# Patient Record
Sex: Male | Born: 1970 | Race: White | Hispanic: No | Marital: Single | Smoking: Current every day smoker
Health system: Southern US, Community
[De-identification: ages and names within clinical notes are randomized; demographics above are authoritative.]

## PROBLEM LIST (undated history)

## (undated) DIAGNOSIS — K219 Gastro-esophageal reflux disease without esophagitis: Secondary | ICD-10-CM

## (undated) HISTORY — PX: HIP FRACTURE SURGERY: SHX118

## (undated) HISTORY — PX: HUMERUS FRACTURE SURGERY: SHX670

---

## 2000-10-21 ENCOUNTER — Inpatient Hospital Stay (HOSPITAL_COMMUNITY)
Admission: RE | Admit: 2000-10-21 | Discharge: 2000-10-28 | Payer: Self-pay | Admitting: Physical Medicine and Rehabilitation

## 2008-07-24 ENCOUNTER — Emergency Department: Payer: Self-pay | Admitting: Emergency Medicine

## 2009-02-06 ENCOUNTER — Inpatient Hospital Stay: Payer: Self-pay | Admitting: Internal Medicine

## 2010-03-09 ENCOUNTER — Emergency Department: Payer: Self-pay | Admitting: Emergency Medicine

## 2010-11-26 ENCOUNTER — Emergency Department: Payer: Self-pay | Admitting: Emergency Medicine

## 2011-05-22 ENCOUNTER — Emergency Department: Payer: Self-pay | Admitting: *Deleted

## 2011-09-14 ENCOUNTER — Emergency Department: Payer: Self-pay | Admitting: Emergency Medicine

## 2011-10-07 ENCOUNTER — Emergency Department: Payer: Self-pay | Admitting: Emergency Medicine

## 2011-10-09 ENCOUNTER — Emergency Department: Payer: Self-pay | Admitting: Emergency Medicine

## 2011-10-11 ENCOUNTER — Emergency Department: Payer: Self-pay | Admitting: Emergency Medicine

## 2011-10-13 LAB — WOUND CULTURE

## 2013-06-12 IMAGING — CR CERVICAL SPINE - COMPLETE 4+ VIEW
1 series · 8 of 8 positions shown · non-contrast
Comparison: none

REASON FOR EXAM: painj
COMMENTS:   May transport without cardiac monitor

[Series 1: lat · 0.17mm/px · 8 of 8 slices shown]
[im 1/8]
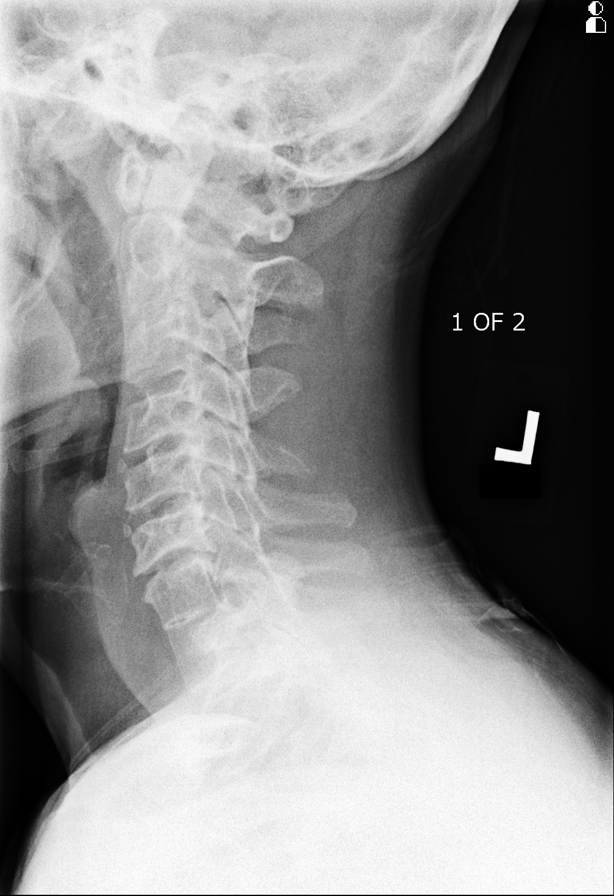
[im 2/8]
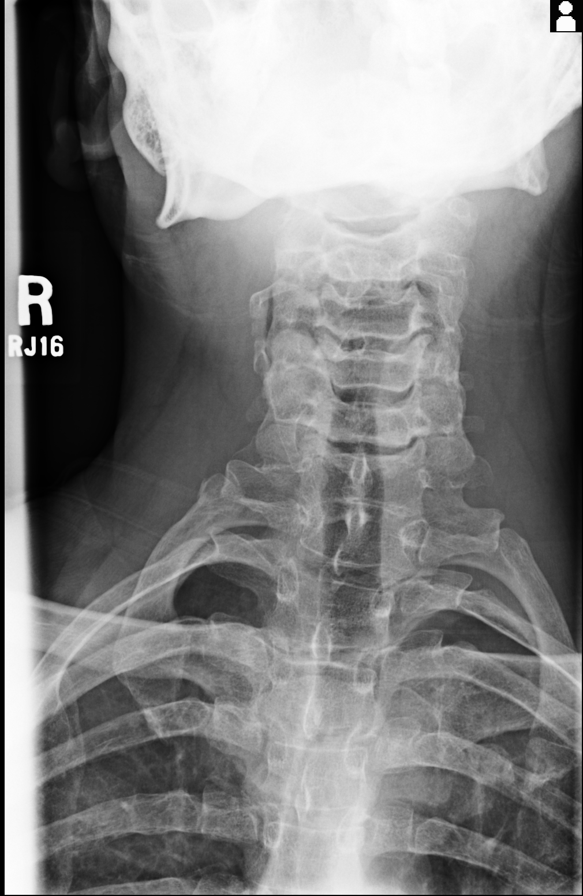
[im 3/8]
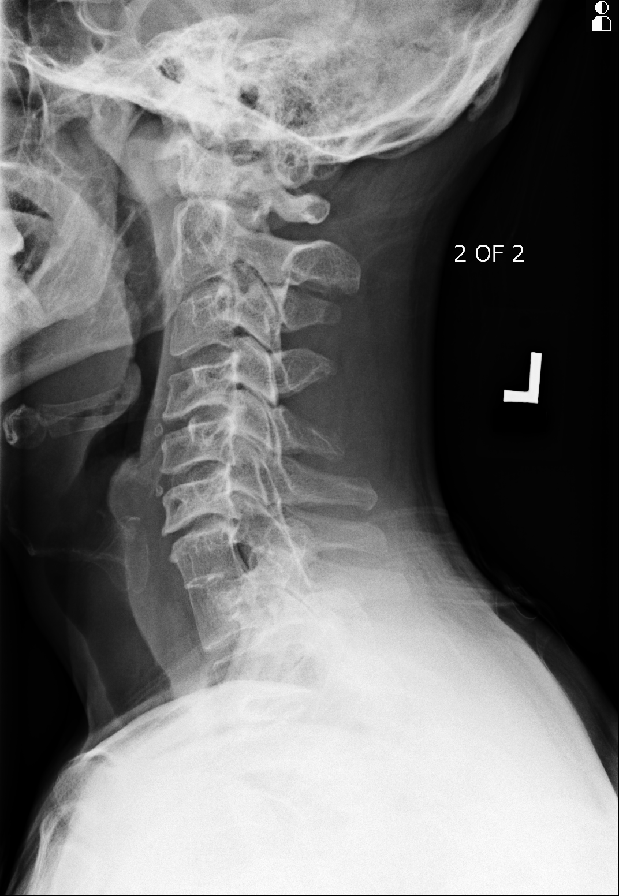
[im 4/8]
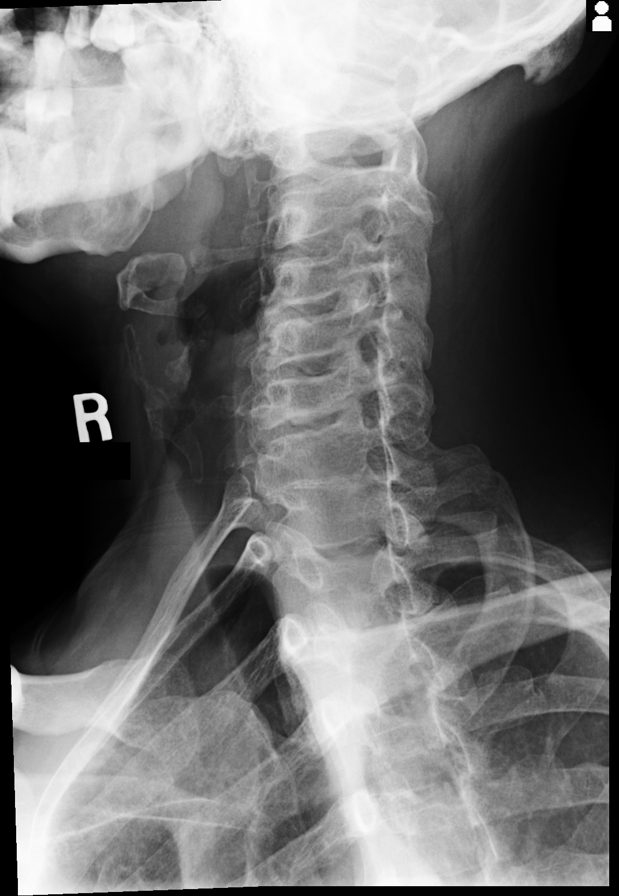
[im 5/8]
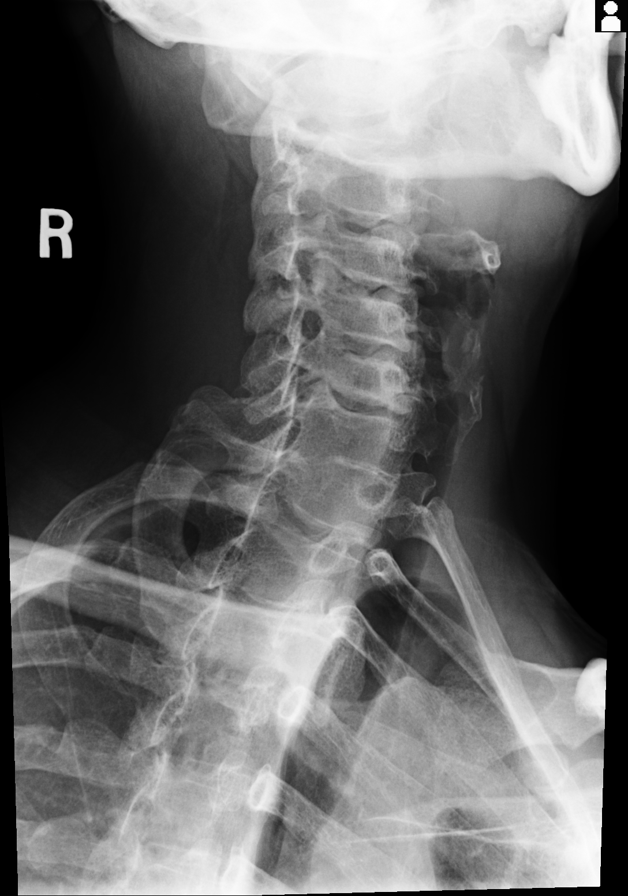
[im 6/8]
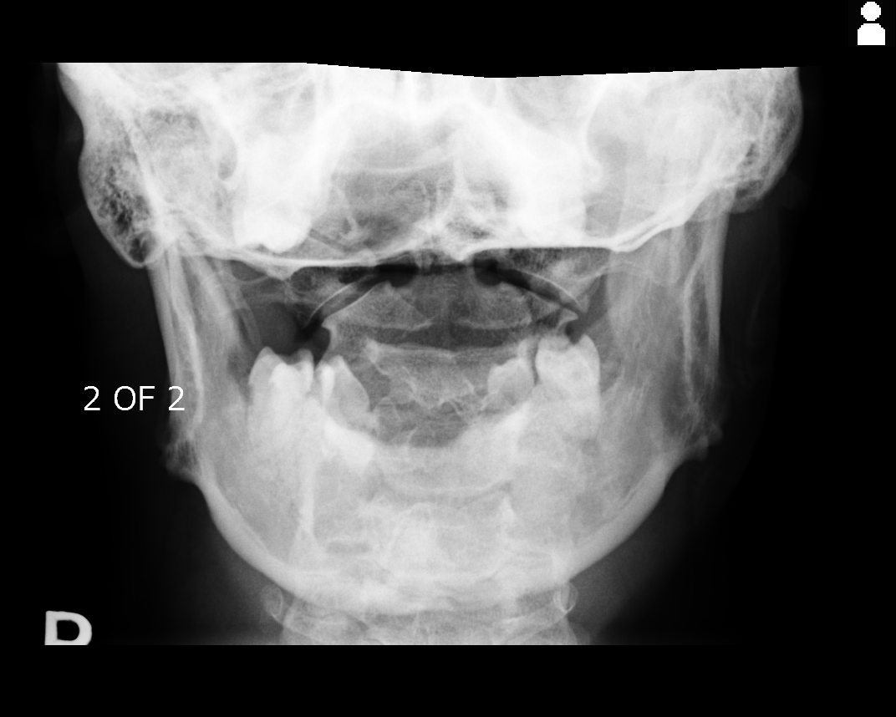
[im 7/8]
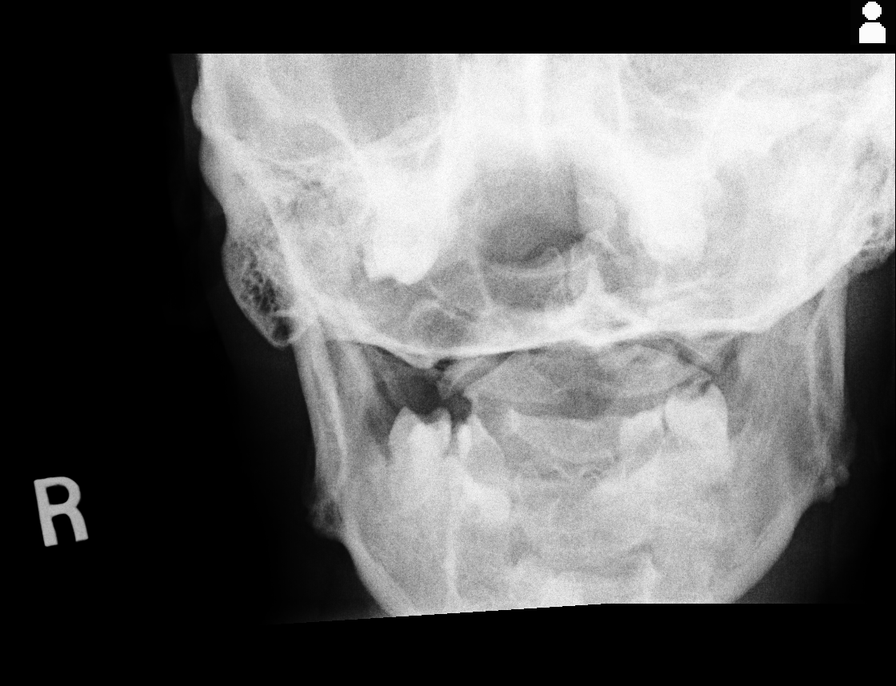
[im 8/8]
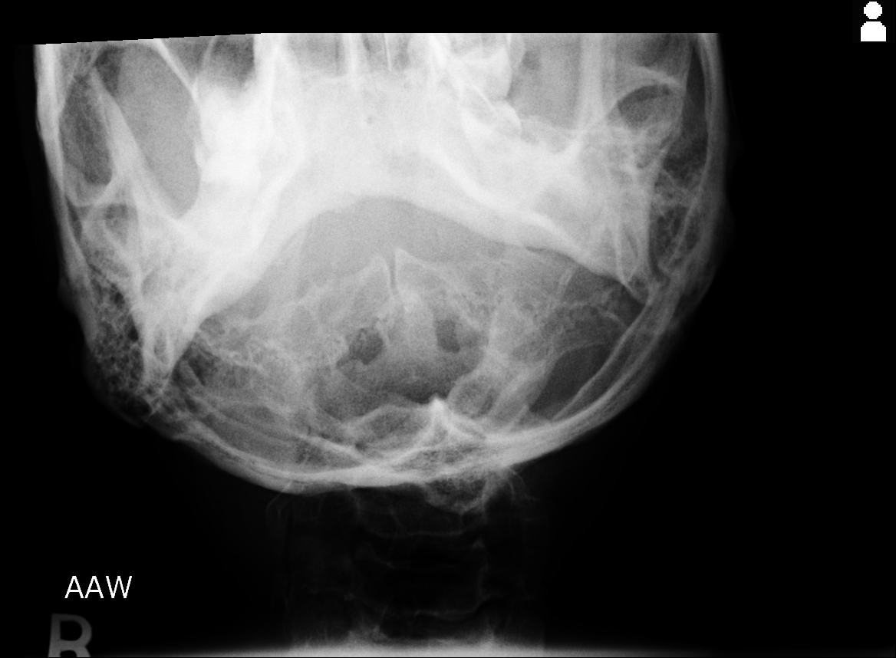

[8 of 8 positions shown; findings below may reference images not displayed]

PROCEDURE:     DXR - DXR CERVICAL SPINE COMPLETE  - September 14, 2011 [DATE]

RESULT:     The lateral film is technically limited and was repeated. The
cervical vertebral bodies are preserved in height. There is fusion across
the C7-T1 disc. The prevertebral soft tissue spaces appear normal. There is
no evidence of a perched facet. There is mild bony encroachment upon the
neural foramina at multiple levels. The lateral masses of C1 appear mildly
splayed with respect to C2. The odontoid is only partially imaged.
IMPRESSION: The study is limited. There are degenerative changes
present. I cannot exclude an acute fracture however, CT scanning is
recommended

## 2013-06-12 IMAGING — CT CT CERVICAL SPINE WITHOUT CONTRAST
1 series · 12 of 14 positions shown, 15 images · non-contrast
Comparison: none

REASON FOR EXAM: pain injury
COMMENTS:

PROCEDURE:     CT  - CT CERVICAL SPINE WO  - September 14, 2011  [DATE]
RESULT:     Cervical spine CT dated 09/14/2011.
TECHNIQUE: Multiplanar imaging the cervical spine was obtained utilizing
helical 2 mm acquisition and bone reconstruction algorithm.

[Series 5: axial · axial · 0.33mm/px · z∈[+336,+474]mm · 12 of 83 slices shown, 15 images]
[im 7/83  soft-tissue]
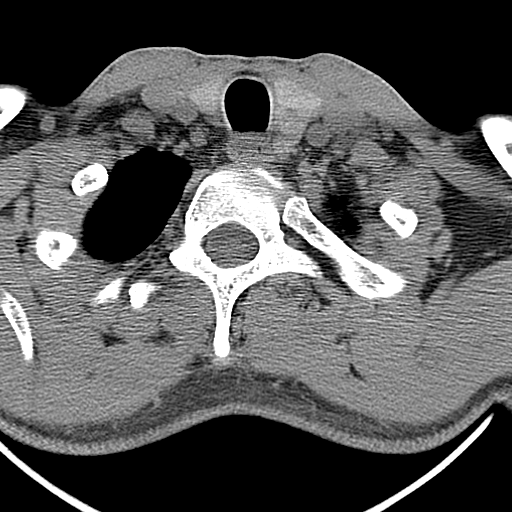
[im 7/83  bone]
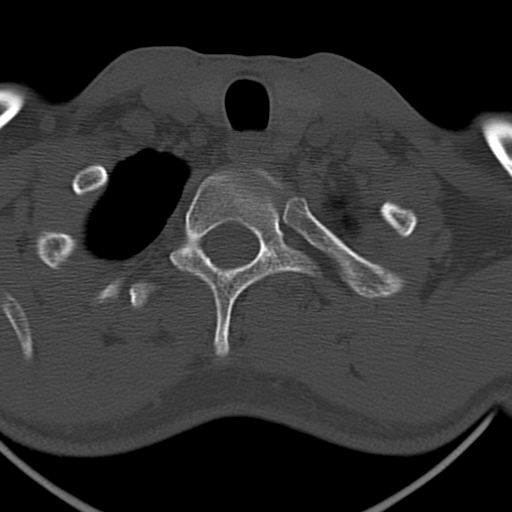
[im 13/83  bone]
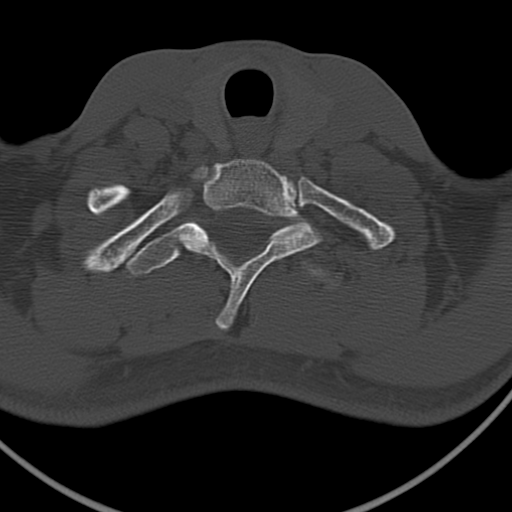
[im 19/83  bone]
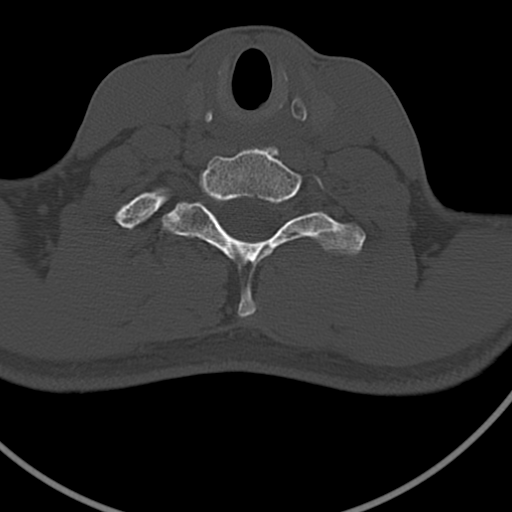
[im 26/83  bone]
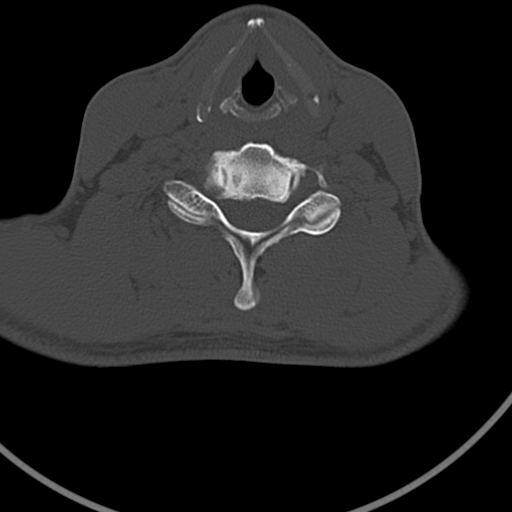
[im 32/83  soft-tissue]
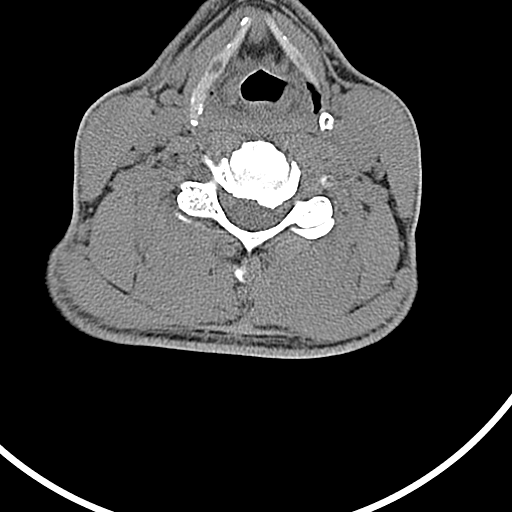
[im 32/83  bone]
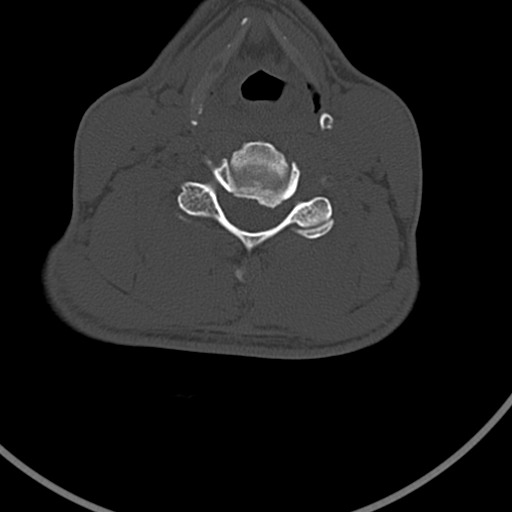
[im 38/83  bone]
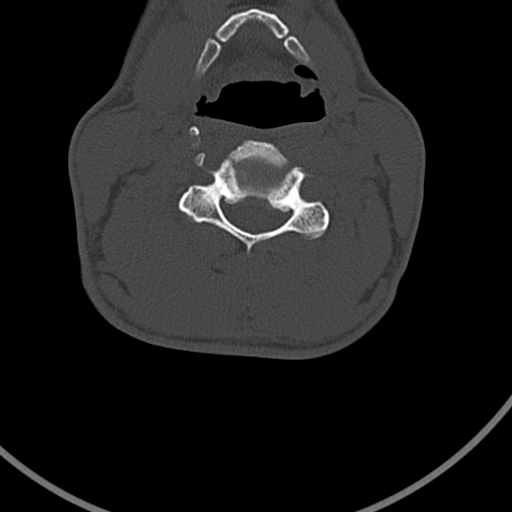
[im 45/83  bone]
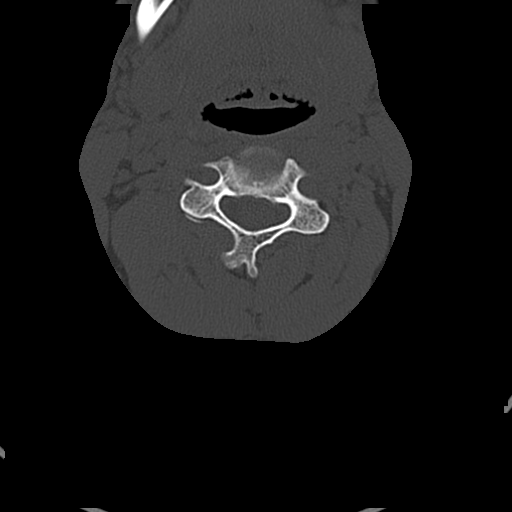
[im 51/83  bone]
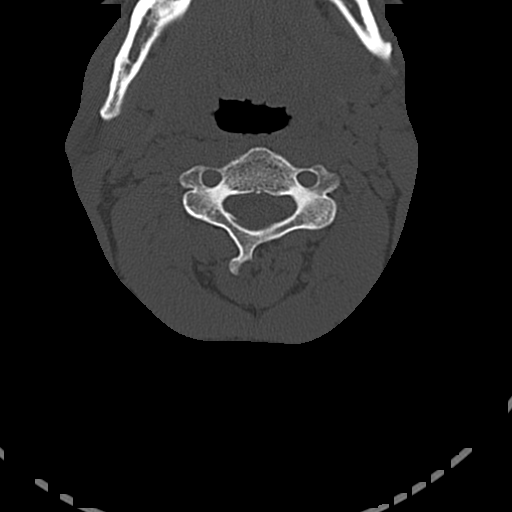
[im 57/83  soft-tissue]
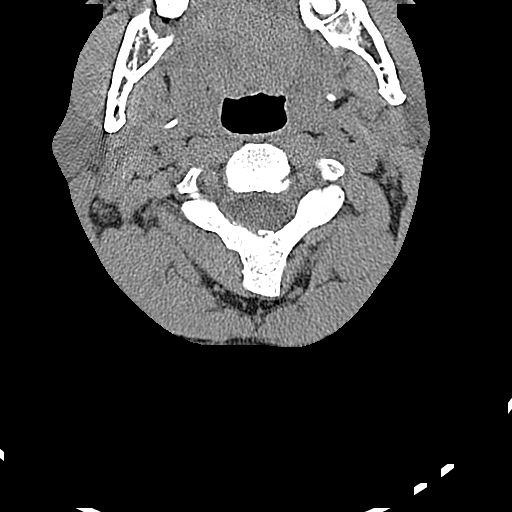
[im 57/83  bone]
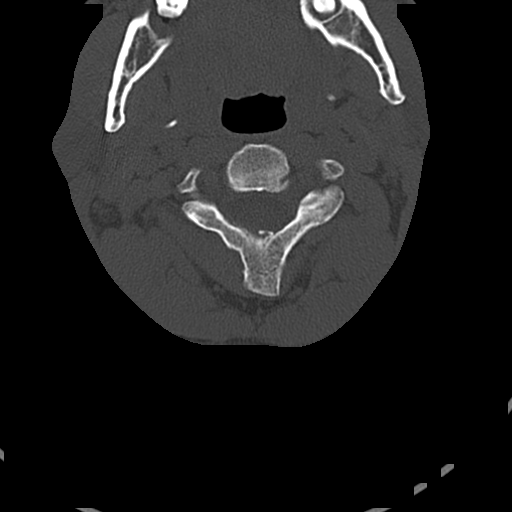
[im 64/83  bone]
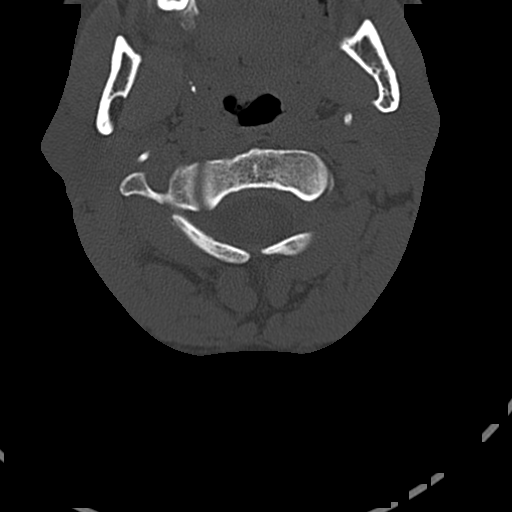
[im 70/83  bone]
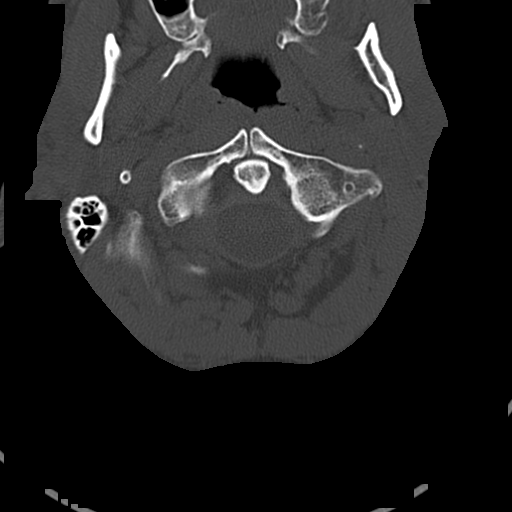
[im 76/83  bone]
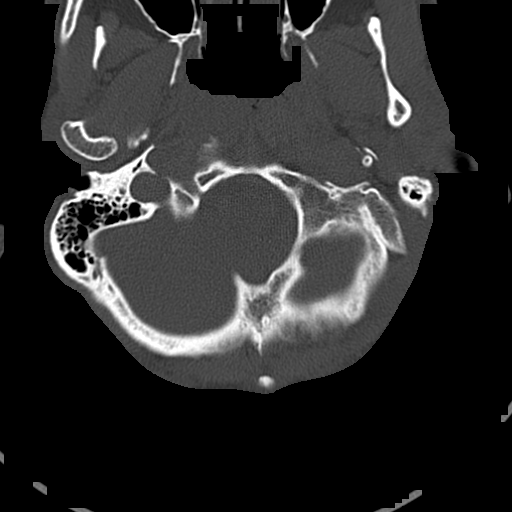

[12 of 14 positions shown; findings below may reference images not displayed]

FINDINGS: There is no evidence of acute fracture nor dislocation. There is a
mild endplate hypertrophic spurring identified. There is fusion of the C7-T1
vertebral bodies. Degenerative disc disease changes are identified, mild, in
the lower cervical spine. There is no evidence of prevertebral soft tissue
swelling.
IMPRESSION: No evidence of acute osseous abnormalities.

## 2020-08-21 ENCOUNTER — Other Ambulatory Visit: Payer: Self-pay

## 2020-08-21 ENCOUNTER — Encounter: Payer: Self-pay | Admitting: Emergency Medicine

## 2020-08-21 ENCOUNTER — Emergency Department: Payer: 59

## 2020-08-21 ENCOUNTER — Emergency Department
Admission: EM | Admit: 2020-08-21 | Discharge: 2020-08-21 | Disposition: A | Discharge status: Home / Self Care | Payer: 59 | Attending: Emergency Medicine | Admitting: Emergency Medicine

## 2020-08-21 DIAGNOSIS — F17210 Nicotine dependence, cigarettes, uncomplicated: Secondary | ICD-10-CM | POA: Diagnosis not present

## 2020-08-21 DIAGNOSIS — M79644 Pain in right finger(s): Secondary | ICD-10-CM | POA: Diagnosis present

## 2020-08-21 DIAGNOSIS — M778 Other enthesopathies, not elsewhere classified: Secondary | ICD-10-CM | POA: Insufficient documentation

## 2020-08-21 NOTE — ED Notes (Signed)
See triage note  Presents with pain and some swelling to right hand  Swelling is mainly at thumb area   Unsure of injury but states he fell couple of weeks ago

## 2020-08-21 NOTE — Discharge Instructions (Addendum)
Call make an appointment with Dr. Okey Dupre who is the orthopedist on-call.  Wear the thumb spica splint for thumb protection and support.  Begin taking meloxicam 1 daily with food for inflammation.  Discontinue taking the ibuprofen that you have been taking at home.

## 2020-08-21 NOTE — ED Provider Notes (Signed)
[facility] Emergency Department Provider Note   ____________________________________________   Event Date/Time   First MD Initiated Contact with Patient 08/21/20 0725     (approximate)  I have reviewed the triage vital signs and the nursing notes.   HISTORY  Chief Complaint Hand Pain   HPI Carl Pena is a 50 y.o. male presents to the ED with complaint of right hand pain for 3 weeks.  Patient is unaware of any known injury.  He states that the pain more to his right thumb.  He has taken ibuprofen occasionally with little improvement.  He states that range of motion with his thumb is difficult at times.  He rates pain as 9 out of 10.       History reviewed. No pertinent past medical history.  There are no problems to display for this patient.   Past Surgical History:  Procedure Laterality Date   HIP FRACTURE SURGERY Right    HUMERUS FRACTURE SURGERY Left     Prior to Admission medications   Not on File    Allergies Patient has no known allergies.  No family history on file.  Social History Social History   Tobacco Use   Smoking status: Every Day    Pack years: 0.00    Types: Cigarettes    Review of Systems Constitutional: No fever/chills Eyes: No visual changes. Cardiovascular: Denies chest pain. Respiratory: Denies shortness of breath. Gastrointestinal: No nausea, no vomiting.  Musculoskeletal: Right thumb pain. Skin: Negative for rash. Neurological: Negative for focal weakness or numbness. ____________________________________________   PHYSICAL EXAM:  VITAL SIGNS: ED Triage Vitals  Enc Vitals Group     BP 08/21/20 0636 (!) 141/89     Pulse Rate 08/21/20 0636 64     Resp 08/21/20 0636 18     Temp 08/21/20 0636 97.9 F (36.6 C)     Temp Source 08/21/20 0636 Oral     SpO2 08/21/20 0636 98 %     Weight 08/21/20 0632 150 lb (68 kg)     Height 08/21/20 0632 5\' 7"  (1.702 m)     Head Circumference --      Peak  Flow --      Pain Score 08/21/20 0632 9     Pain Loc --      Pain Edu? --      Excl. in GC? --     Constitutional: Alert and oriented. Well appearing and in no acute distress. Eyes: Conjunctivae are normal. PERRL. EOMI. Head: Atraumatic. Neck: No stridor.   Cardiovascular: Normal rate, regular rhythm. Grossly normal heart sounds.  Good peripheral circulation. Respiratory: Normal respiratory effort.  No retractions. Lungs CTAB. Musculoskeletal: Examination of the right hand there is no gross deformity.  Patient has difficulty flexing the distal phalanx.  He is able to passively flex the phalanx and then extend.  No swelling to the joint, erythema or warmth is noted.  Motor sensory function intact.  Capillary refill is less than 3 seconds.  Radial pulse present. Neurologic:  Normal speech and language. No gross focal neurologic deficits are appreciated. No gait instability. Skin:  Skin is warm, dry and intact. No rash noted. Psychiatric: Mood and affect are normal. Speech and behavior are normal.  ____________________________________________   LABS (all labs ordered are listed, but only abnormal results are displayed)  Labs Reviewed - No data to display ____________________________________________  ____________________________________________  RADIOLOGY 10/21/20, personally viewed and evaluated these images (plain radiographs) as part  of my medical decision making, as well as reviewing the written report by the radiologist.   Official radiology report(s): DG Hand Complete Right  Result Date: 08/21/2020 CLINICAL DATA:  Right hand pain EXAM: RIGHT HAND - COMPLETE 3+ VIEW COMPARISON:  None. FINDINGS: There is no evidence of fracture or dislocation. There is no evidence of arthropathy or other focal bone abnormality. Soft tissues are unremarkable. IMPRESSION: Negative. Electronically Signed   By: Marnee Spring M.D.   On: 08/21/2020 07:35     ____________________________________________   PROCEDURES  Procedure(s) performed (including Critical Care):  Procedures  Thumb spica splint was applied by ED tech. ____________________________________________   INITIAL IMPRESSION / ASSESSMENT AND PLAN / ED COURSE  As part of my medical decision making, I reviewed the following data within the electronic MEDICAL RECORD NUMBER   50 year old male presents to the ED with complaint of pain to the base of his right thumb without history of injury.  She states has been going on for 3 weeks and he has occasionally taken some ibuprofen without any relief.  He denies any problems with his thumb in the past.  On exam there is some difficulty with flexion of the distal phalanx.  No edema noted at the joint area.  Patient is able to passively flex and extend his thumb without any difficulty.  At this point we will place him in a thumb spica splint and anti-inflammatories.  He is encouraged to follow-up with Dr. Okey Dupre who is on-call for orthopedics for follow-up.  Patient was made aware that his x-rays were negative for any acute bony changes.  Note was written so that he can wear the thumb spica splint to work.  ____________________________________________   FINAL CLINICAL IMPRESSION(S) / ED DIAGNOSES  Final diagnoses:  Thumb tendonitis     ED Discharge Orders     None        Note:  This document was prepared using Dragon voice recognition software and may include unintentional dictation errors.    Tommi Rumps, PA-C 08/21/20 1437    Gilles Chiquito, MD 08/22/20 [phone]

## 2020-08-21 NOTE — ED Triage Notes (Addendum)
Patient ambulatory to triage with steady gait, without difficulty or distress noted; pt reports pain to palmar side of rt hand around base of thumb with no known injury noted

## 2022-12-03 ENCOUNTER — Ambulatory Visit: Payer: 59

## 2022-12-03 DIAGNOSIS — K5730 Diverticulosis of large intestine without perforation or abscess without bleeding: Secondary | ICD-10-CM | POA: Diagnosis not present

## 2022-12-03 DIAGNOSIS — K635 Polyp of colon: Secondary | ICD-10-CM | POA: Diagnosis not present

## 2022-12-03 DIAGNOSIS — D120 Benign neoplasm of cecum: Secondary | ICD-10-CM | POA: Diagnosis not present

## 2022-12-03 DIAGNOSIS — Z1211 Encounter for screening for malignant neoplasm of colon: Secondary | ICD-10-CM | POA: Diagnosis present

## 2023-01-26 ENCOUNTER — Other Ambulatory Visit: Payer: Self-pay

## 2023-01-26 ENCOUNTER — Emergency Department
Admission: EM | Admit: 2023-01-26 | Discharge: 2023-01-26 | Disposition: A | Discharge status: Home / Self Care | Payer: 59 | Attending: Emergency Medicine | Admitting: Emergency Medicine

## 2023-01-26 ENCOUNTER — Encounter: Payer: Self-pay | Admitting: Emergency Medicine

## 2023-01-26 ENCOUNTER — Emergency Department: Payer: 59

## 2023-01-26 DIAGNOSIS — Y92410 Unspecified street and highway as the place of occurrence of the external cause: Secondary | ICD-10-CM | POA: Insufficient documentation

## 2023-01-26 DIAGNOSIS — M545 Low back pain, unspecified: Secondary | ICD-10-CM | POA: Diagnosis present

## 2023-01-26 MED ORDER — NAPROXEN 500 MG PO TABS: 500.0000 mg | ORAL_TABLET | Freq: Two times a day (BID) | ORAL | 0 refills | Status: DC
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
## 2023-01-26 NOTE — ED Notes (Signed)
Pt. To rad. 

## 2023-01-26 NOTE — ED Notes (Signed)
Pt to rad
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
## 2023-01-26 NOTE — Discharge Instructions (Signed)
Follow-up with your primary care provider if any continued problems or concerns.  Naproxen 500 mg twice daily with food.  You may use ice or heat to your back as needed for discomfort.

## 2023-01-26 NOTE — ED Notes (Signed)
Pt. Laying in bed, eyes open, alert and oriented.

## 2023-08-24 ENCOUNTER — Emergency Department
Admission: EM | Admit: 2023-08-24 | Discharge: 2023-08-24 | Disposition: A | Discharge status: Home / Self Care | Attending: Emergency Medicine | Admitting: Emergency Medicine

## 2023-08-24 ENCOUNTER — Other Ambulatory Visit: Payer: Self-pay

## 2023-08-24 ENCOUNTER — Encounter: Payer: Self-pay | Admitting: Emergency Medicine

## 2023-08-24 ENCOUNTER — Emergency Department

## 2023-08-24 DIAGNOSIS — F17200 Nicotine dependence, unspecified, uncomplicated: Secondary | ICD-10-CM | POA: Diagnosis not present

## 2023-08-24 DIAGNOSIS — R011 Cardiac murmur, unspecified: Secondary | ICD-10-CM | POA: Diagnosis not present

## 2023-08-24 DIAGNOSIS — R0789 Other chest pain: Secondary | ICD-10-CM | POA: Diagnosis present

## 2023-08-24 DIAGNOSIS — R079 Chest pain, unspecified: Secondary | ICD-10-CM

## 2023-08-24 LAB — CBC
HCT: 43.6 % (ref 39.0–52.0)
Hemoglobin: 15 g/dL (ref 13.0–17.0)
MCH: 31.1 pg (ref 26.0–34.0)
MCHC: 34.4 g/dL (ref 30.0–36.0)
MCV: 90.3 fL (ref 80.0–100.0)
Platelets: 156 10*3/uL (ref 150–400)
RBC: 4.83 MIL/uL (ref 4.22–5.81)
RDW: 12.7 % (ref 11.5–15.5)
WBC: 10.2 10*3/uL (ref 4.0–10.5)
nRBC: 0 % (ref 0.0–0.2)

## 2023-08-24 LAB — COMPREHENSIVE METABOLIC PANEL WITH GFR
ALT: 21 U/L (ref 0–44)
AST: 26 U/L (ref 15–41)
Albumin: 3.7 g/dL (ref 3.5–5.0)
Alkaline Phosphatase: 52 U/L (ref 38–126)
Anion gap: 12 (ref 5–15)
BUN: 18 mg/dL (ref 6–20)
CO2: 21 mmol/L — ABNORMAL LOW (ref 22–32)
Calcium: 8.6 mg/dL — ABNORMAL LOW (ref 8.9–10.3)
Chloride: 107 mmol/L (ref 98–111)
Creatinine, Ser: 0.71 mg/dL (ref 0.61–1.24)
GFR, Estimated: >60 mL/min (ref >60)
Glucose, Bld: 91 mg/dL (ref 70–99)
Potassium: 3.8 mmol/L (ref 3.5–5.1)
Sodium: 140 mmol/L (ref 135–145)
Total Bilirubin: 0.8 mg/dL (ref 0.0–1.2)
Total Protein: 6.1 g/dL — ABNORMAL LOW (ref 6.5–8.1)

## 2023-08-24 LAB — TROPONIN I (HIGH SENSITIVITY)
Troponin I (High Sensitivity): 4 ng/L (ref <18)
Troponin I (High Sensitivity): 4 ng/L (ref <18)

## 2023-08-24 MED ORDER — FENTANYL CITRATE PF 50 MCG/ML IJ SOSY
50.0000 ug | PREFILLED_SYRINGE | Freq: Once | INTRAMUSCULAR | Status: AC
  Administered 2023-08-24: 50 ug via INTRAVENOUS
  Filled 2023-08-24: qty 1

## 2023-08-24 MED ORDER — NITROGLYCERIN 0.4 MG SL SUBL: 0.4000 mg | SUBLINGUAL_TABLET | SUBLINGUAL | Status: DC | PRN

## 2023-08-24 MED ORDER — SODIUM CHLORIDE 0.9 % IV BOLUS
1000.0000 mL | Freq: Once | INTRAVENOUS | Status: AC
  Administered 2023-08-24: 1000 mL via INTRAVENOUS

## 2023-08-24 NOTE — ED Triage Notes (Signed)
 Patient to ED via [facility] from work for CP. Started around 0900- right sided, chest tightness. Given 324 ASA and 2 nitroglycerin. Denies cardiac hx.  56 HR 102/60 97% RA

## 2023-08-24 NOTE — Discharge Instructions (Addendum)
 You were seen in the Emergency Department today for evaluation of your chest pain. Fortunately, your labs, EKG, and chest x-Carl Pena were overall reassuring against a emergency cause for your pain. I have placed a referral to cardiology for further evaluation of your chest pain.  Return to the ER for any new or worsening symptoms including worsening chest pain, difficulty breathing, or any other new or concerning symptoms that you believe warrants immediate attention.

## 2023-08-24 NOTE — ED Provider Notes (Signed)
 Care of this patient assumed from prior physician at 1500 pending repeat troponin and EKG, anticipated discharge if reassuring. Please see prior physician note for further details.  Briefly this is a 53 year old male who presented with chest pain.  Labs overall reassuring including negative initial troponin.  Initial EKG without acute ischemic findings.  CXR without focal consolidation.  He did have some ongoing chest pain, so a repeat EKG and troponin were planned.  Repeat EKG demonstrated sinus rhythm at a rate of 53, PAC noted, incomplete right bundle branch block noted.  Nonspecific ST changes noted.  Repeat troponin returned within normal limits.  Patient was reassessed.  Discussed results of his workup.  Patient feels improved.  I did discuss possibility of admission, heart score 4.  Patient reports he prefers to be discharged home.  Given improvement in his pain and reassuring workup, do think discharge with close outpatient follow-up is reasonable.  Referral placed to cardiology.  Strict return precautions provided.  Patient discharged in stable condition.   Claria Crofts, MD 08/24/23 Allison Ivory

## 2023-08-24 NOTE — ED Provider Notes (Signed)
 [facility] Provider Note    Event Date/Time   First MD Initiated Contact with Patient 08/24/23 1246     (approximate)   History   Chest Pain   HPI  Carl Pena is a 53 y.o. male past medical history significant for tobacco use, family history coronary artery disease, who presents to the emergency department chest pain.  Chest pain started today and progressively worsened while he was at work.  Endorses chest pressure sensation.  Worse with he had exertion.  Did have some improvement after nitroglycerin with EMS but then immediately returned.  Denies nausea vomiting or diaphoresis.  No history of DVT or PE.  Does endorse daily tobacco use of approximately 1 pack.  No recent travel.  Not on anticoagulation.  States that he follows regularly with primary care provider.  Family history coronary artery disease in his father at age 31.  No prior stress testing or cardiac catheterization.  Currently rates his pain a 6/10.  Received aspirin and nitroglycerin with EMS.     Physical Exam   Triage Vital Signs: ED Triage Vitals  Encounter Vitals Group     BP 08/24/23 1250 109/74     Systolic BP Percentile --      Diastolic BP Percentile --      Pulse Rate 08/24/23 1250 (!) 57     Resp 08/24/23 1250 17     Temp 08/24/23 1250 98.3 F (36.8 C)     Temp Source 08/24/23 1250 Oral     SpO2 08/24/23 1250 97 %     Weight 08/24/23 1248 149 lb 14.6 oz (68 kg)     Height 08/24/23 1248 5' 7 (1.702 m)     Head Circumference --      Peak Flow --      Pain Score 08/24/23 1248 6     Pain Loc --      Pain Education --      Exclude from Growth Chart --     Most recent vital signs: Vitals:   08/24/23 1430 08/24/23 1505  BP: (!) 122/92   Pulse: 71 (!) 52  Resp: 12 13  Temp:    SpO2: 96% 99%    Physical Exam Constitutional:      Appearance: He is well-developed.  HENT:     Head: Atraumatic.  Eyes:     Conjunctiva/sclera: Conjunctivae normal.   Cardiovascular:     Rate and Rhythm: Regular rhythm.     Heart sounds: Murmur heard.  Pulmonary:     Effort: No respiratory distress.  Abdominal:     Palpations: Abdomen is soft.     Tenderness: There is no abdominal tenderness.  Musculoskeletal:     Cervical back: Normal range of motion.     Right lower leg: No edema.     Left lower leg: No edema.  Skin:    General: Skin is warm.     Capillary Refill: Capillary refill takes less than 2 seconds.  Neurological:     General: No focal deficit present.     Mental Status: He is alert. Mental status is at baseline.     IMPRESSION / MDM / ASSESSMENT AND PLAN / ED COURSE  I reviewed the triage vital signs and the nursing notes.  Differential diagnosis including ACS, anemia, pneumonia, COPD, malignancy, MSK.  Have low suspicion for pulmonary embolism, low risk Wells criteria and otherwise other than age would be PERC negative.  Low suspicion for dissection, no  tearing chest pain and pulses are equal and symmetric  EKG  I, Viviano Ground, the attending physician, personally viewed and interpreted this ECG.   Rate: Normal  Rhythm: Normal sinus  Axis: Normal  Intervals: Normal  ST&T Change: None  No tachycardic or bradycardic dysrhythmias while on cardiac telemetry.  RADIOLOGY I independently reviewed imaging, my interpretation of imaging: Chest x-ray was read as no active cardiopulmonary disease.  Trace atelectasis to bilateral lung bases.  No acute infiltrate or pulmonary effusion.  No pneumothorax noted.  LABS (all labs ordered are listed, but only abnormal results are displayed) Labs interpreted as -    Labs Reviewed  COMPREHENSIVE METABOLIC PANEL WITH GFR - Abnormal; Notable for the following components:      Result Value   CO2 21 (*)    Calcium 8.6 (*)    Total Protein 6.1 (*)    All other components within normal limits  CBC  URINE DRUG SCREEN, QUALITATIVE ([facility] ONLY)  TROPONIN I (HIGH SENSITIVITY)  TROPONIN I  (HIGH SENSITIVITY)     MDM  Patient was given nitroglycerin.  Initial round of lab work with no significant electrolyte abnormality.  No leukocytosis.  No significant anemia.  Initial troponin is negative at 4.  On reevaluation continues to have chest pain.  Will do a repeat EKG.  Second troponin is currently pending.  Given another dose of fentanyl given that his blood pressure was too low for nitroglycerin.  Low suspicion for acute pericarditis, no rub, no change in position and no pericardial effusion on my exam.  Appears to have a grossly normal EF.  Adamantly denies any cocaine use.  Patient has a heart score of 4.    PROCEDURES:  Critical Care performed: No  Ultrasound ED Echo  Date/Time: 08/24/2023 3:28 PM  Performed by: Viviano Ground, MD Authorized by: Viviano Ground, MD   Procedure details:    Indications: chest pain     Views: subxiphoid, parasternal long axis view, parasternal short axis view, apical 4 chamber view and IVC view     Images: not archived     Limitations:  Positioning and acoustic shadowing Findings:    Pericardium: no pericardial effusion     LV Function: normal (>50% EF)     RV Diameter: normal     IVC: normal   Impression:    Impression: normal     Patient's presentation is most consistent with acute presentation with potential threat to life or bodily function.   MEDICATIONS ORDERED IN ED: Medications  nitroGLYCERIN (NITROSTAT) SL tablet 0.4 mg (has no administration in time range)  fentaNYL (SUBLIMAZE) injection 50 mcg (has no administration in time range)  sodium chloride 0.9 % bolus 1,000 mL (1,000 mLs Intravenous New Bag/Given 08/24/23 1417)  fentaNYL (SUBLIMAZE) injection 50 mcg (50 mcg Intravenous Given 08/24/23 1418)    FINAL CLINICAL IMPRESSION(S) / ED DIAGNOSES   Final diagnoses:  Chest pain, unspecified type     Rx / DC Orders   ED Discharge Orders     None        Note:  This document was prepared using Dragon voice  recognition software and may include unintentional dictation errors.   Viviano Ground, MD 08/24/23 1529

## 2023-10-12 ENCOUNTER — Emergency Department

## 2023-10-12 ENCOUNTER — Other Ambulatory Visit: Payer: Self-pay

## 2023-10-12 ENCOUNTER — Emergency Department
Admission: EM | Admit: 2023-10-12 | Discharge: 2023-10-12 | Disposition: A | Discharge status: Home / Self Care | Attending: Emergency Medicine | Admitting: Emergency Medicine

## 2023-10-12 DIAGNOSIS — R079 Chest pain, unspecified: Secondary | ICD-10-CM | POA: Diagnosis present

## 2023-10-12 HISTORY — DX: Gastro-esophageal reflux disease without esophagitis: K21.9

## 2023-10-12 LAB — CBC
HCT: 45.2 % (ref 39.0–52.0)
Hemoglobin: 15.2 g/dL (ref 13.0–17.0)
MCH: 30.6 pg (ref 26.0–34.0)
MCHC: 33.6 g/dL (ref 30.0–36.0)
MCV: 91.1 fL (ref 80.0–100.0)
Platelets: 160 K/uL (ref 150–400)
RBC: 4.96 MIL/uL (ref 4.22–5.81)
RDW: 12.6 % (ref 11.5–15.5)
WBC: 8 K/uL (ref 4.0–10.5)
nRBC: 0 % (ref 0.0–0.2)

## 2023-10-12 LAB — BASIC METABOLIC PANEL WITH GFR
Anion gap: 8 (ref 5–15)
BUN: 15 mg/dL (ref 6–20)
CO2: 25 mmol/L (ref 22–32)
Calcium: 9.3 mg/dL (ref 8.9–10.3)
Chloride: 108 mmol/L (ref 98–111)
Creatinine, Ser: 0.75 mg/dL (ref 0.61–1.24)
GFR, Estimated: >60 mL/min (ref >60)
Glucose, Bld: 106 mg/dL — ABNORMAL HIGH (ref 70–99)
Potassium: 3.9 mmol/L (ref 3.5–5.1)
Sodium: 141 mmol/L (ref 135–145)

## 2023-10-12 LAB — D-DIMER, QUANTITATIVE: D-Dimer, Quant: 0.31 ug{FEU}/mL (ref 0.00–0.50)

## 2023-10-12 LAB — TROPONIN I (HIGH SENSITIVITY)
Troponin I (High Sensitivity): 4 ng/L (ref <18)
Troponin I (High Sensitivity): 4 ng/L (ref <18)

## 2023-10-12 MED ORDER — KETOROLAC TROMETHAMINE 30 MG/ML IJ SOLN
15.0000 mg | Freq: Once | INTRAMUSCULAR | Status: AC
  Administered 2023-10-12: 15 mg via INTRAVENOUS
  Filled 2023-10-12: qty 1

## 2023-10-12 NOTE — Discharge Instructions (Signed)
 You were seen in the Emergency Department today for evaluation of your chest pain. Fortunately, your labs, EKG, and chest x-Zeppelin Beckstrand were overall reassuring against a emergency cause for your pain.  Keep your scheduled follow-up with cardiology.  Return to the ER for any new or worsening symptoms including worsening chest pain, difficulty breathing, or any other new or concerning symptoms that you believe warrants immediate attention.

## 2023-10-12 NOTE — ED Triage Notes (Signed)
 Patient states intermittent chest pain and shortness of breath that started this morning; denies radiation.

## 2023-10-12 NOTE — ED Provider Notes (Signed)
 [facility] Provider Note    Event Date/Time   First MD Initiated Contact with Patient 10/12/23 [phone]     (approximate)   History   Chest Pain   HPI  Carl Pena is a 53 year old male presenting ER for evaluation of chest pain.  Reports this morning around 7 AM he had onset of chest pain described as a pressure sensation over the right side of his chest.  Nonspecifically pleuritic or exertional.  Does report some associated shortness of breath.  Feels similar to pain he had last month, seen in the ER with reassuring workup at that time.     Physical Exam   Triage Vital Signs: ED Triage Vitals  Encounter Vitals Group     BP 10/12/23 0753 118/75     Girls Systolic BP Percentile --      Girls Diastolic BP Percentile --      Boys Systolic BP Percentile --      Boys Diastolic BP Percentile --      Pulse Rate 10/12/23 0753 (!) 57     Resp 10/12/23 0753 18     Temp 10/12/23 0753 97.8 F (36.6 C)     Temp Source 10/12/23 0753 Oral     SpO2 10/12/23 0753 99 %     Weight 10/12/23 0751 160 lb (72.6 kg)     Height 10/12/23 0751 5' 7 (1.702 m)     Head Circumference --      Peak Flow --      Pain Score 10/12/23 0751 4     Pain Loc --      Pain Education --      Exclude from Growth Chart --     Most recent vital signs: Vitals:   10/12/23 0753  BP: 118/75  Pulse: (!) 57  Resp: 18  Temp: 97.8 F (36.6 C)  SpO2: 99%     General: Awake, interactive  CV:  Regular rate, good peripheral perfusion.  Resp:  Unlabored respirations, lungs clear to auscultation Abd:  Nondistended, soft, nontender Neuro:  Symmetric facial movement, fluid speech   ED Results / Procedures / Treatments   Labs (all labs ordered are listed, but only abnormal results are displayed) Labs Reviewed  BASIC METABOLIC PANEL WITH GFR - Abnormal; Notable for the following components:      Result Value   Glucose, Bld 106 (*)    All other components within normal limits   CBC  D-DIMER, QUANTITATIVE  TROPONIN I (HIGH SENSITIVITY)  TROPONIN I (HIGH SENSITIVITY)     EKG EKG independently reviewed and interpreted by myself demonstrates:  EKG demonstrates sinus bradycardia at a rate of 56, PR 178, QRS 98, QTc 416, no acute ST changes, incomplete right bundle branch block  RADIOLOGY Imaging independently reviewed and interpreted by myself demonstrates:  CXR without focal consolidation  Formal Radiology Read:  DG Chest 2 View Result Date: 10/12/2023 CLINICAL DATA:  Chest pain. EXAM: CHEST - 2 VIEW COMPARISON:  08/24/2023. FINDINGS: Bilateral lung fields are clear. Bilateral costophrenic angles are clear. Normal cardio-mediastinal silhouette. No acute osseous abnormalities. The soft tissues are within normal limits. IMPRESSION: No active cardiopulmonary disease. Electronically Signed   By: Ree Molt M.D.   On: 10/12/2023 08:24    PROCEDURES:  Critical Care performed: No  Procedures   MEDICATIONS ORDERED IN ED: Medications  ketorolac  (TORADOL ) 30 MG/ML injection 15 mg (15 mg Intravenous Given 10/12/23 0931)     IMPRESSION / MDM / ASSESSMENT  AND PLAN / ED COURSE  I reviewed the triage vital signs and the nursing notes.  Differential diagnosis includes, but is not limited to ACS, pneumonia, pneumothorax, lower suspicion PE or aortic dissection, musculoskeletal strain, stress-mediated physiologic response  Patient's presentation is most consistent with acute presentation with potential threat to life or bodily function.  Presents with chest pain. Labs, EKG, CXR reassuring. Negative troponin x2.  Negative D-dimer.  Reviewed workup with patient. They are comfortable with discharge home and outpatient follow-up with strict return precautions.  Patient reports he has planned follow-up with cardiology arranged.  Patient discharged in stable condition.     FINAL CLINICAL IMPRESSION(S) / ED DIAGNOSES   Final diagnoses:  Nonspecific chest pain      Rx / DC Orders   ED Discharge Orders     None        Note:  This document was prepared using Dragon voice recognition software and may include unintentional dictation errors.   Levander Slate, MD 10/12/23 [phone]

## 2023-10-20 ENCOUNTER — Encounter: Payer: Self-pay | Admitting: *Deleted

## 2023-10-20 ENCOUNTER — Telehealth: Payer: Self-pay | Admitting: *Deleted

## 2023-10-20 NOTE — Telephone Encounter (Signed)
LVM to verify card hx.

## 2023-10-24 DIAGNOSIS — R079 Chest pain, unspecified: Secondary | ICD-10-CM | POA: Insufficient documentation

## 2023-10-24 DIAGNOSIS — F17200 Nicotine dependence, unspecified, uncomplicated: Secondary | ICD-10-CM | POA: Insufficient documentation

## 2023-10-24 NOTE — Progress Notes (Signed)
 Cardiology Office Note  Date:  10/25/2023   ID:  EILAM SHREWSBURY, DOB [birth year], MRN [id number]  PCP:  Montey Lot, PA-C patient  Chief Complaint  Patient presents with   New Patient (Initial Visit)    Patient is following up from [facility] ER; chest pain. Patient c/o chest pain off on for 2 weeks with occasional shortness of breath with and without exertion.     HPI:  Dorsel Flinn Johnsonis a 53 y.o. malewith past medical history of: Smoker >1 ppd Who presents by referral from Dr. 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 Nonspecifically pleuritic or exertional. Does report some associated shortness of breath. Feels similar to pain he had last month, seen in the ER with reassuring workup at that time  - Cardiac workup negative Received Toradol , helped the pain  Currently without chest pain Reports he received meloxicam which helps his pain  Limited echo done in the ER August 24, 2023 reported as normal, images not available for review  EKG personally reviewed by myself on todays visit EKG Interpretation Date/Time:  Tuesday October 25 2023 08:56:06 EDT Ventricular Rate:  58 PR Interval:  164 QRS Duration:  96 QT Interval:  432 QTC Calculation: 424 R Axis:   -58  Text Interpretation: Sinus bradycardia with Premature atrial complexes Left axis deviation When compared with ECG of 12-Oct-2023 07:54, Premature atrial complexes are now Present Confirmed by Perla Lye [phone]) on 10/25/2023 9:02:43 AM    PMH:   has a past medical history of GERD (gastroesophageal reflux disease).  PSH:    Past Surgical History:  Procedure Laterality Date   HIP  FRACTURE SURGERY Right    HUMERUS FRACTURE SURGERY Left     Current Outpatient Medications  Medication Sig Dispense Refill   acetaminophen (TYLENOL) 325 MG tablet Take 650 mg by mouth every 6 (six) hours as needed.     meloxicam (MOBIC) 15 MG tablet Take 15 mg by mouth daily as needed.     pantoprazole (PROTONIX) 40 MG tablet Take 40 mg by mouth daily.     No current facility-administered medications for this visit.    Allergies:   Patient has no known allergies.   Social History:  The patient  reports that he has been smoking cigarettes. He started smoking about 37 years ago. He has a 55.5 pack-year smoking history. He does not have any smokeless tobacco history on file. He reports that he does not drink alcohol and does not use drugs.   Family History:   family history includes Heart attack in his father; Heart disease in his father; Hyperlipidemia in his father; Hypertension in his father.    Review of Systems: Review of Systems  Constitutional: Negative.   HENT: Negative.    Respiratory: Negative.    Cardiovascular: Negative.   Gastrointestinal: Negative.   Musculoskeletal: Negative.   Neurological: Negative.   Psychiatric/Behavioral: Negative.    All other systems reviewed and are negative.   PHYSICAL EXAM: VS:  BP 106/80 (BP Location: Right Arm, Patient Position: Sitting, Cuff Size: Normal)   Pulse (!) 58   Ht 5'  7 (1.702 m)   Wt 160 lb (72.6 kg)   SpO2 97%   BMI 25.06 kg/m  , BMI Body mass index is 25.06 kg/m. GEN: Well nourished, well developed, in no acute distress HEENT: normal Neck: no JVD, carotid bruits, or masses Cardiac: RRR; no murmurs, rubs, or gallops,no edema  Respiratory:  clear to auscultation bilaterally, normal work of breathing GI: soft, nontender, nondistended, + BS MS: no deformity or atrophy Skin: warm and dry, no rash Neuro:  Strength and sensation are intact Psych: euthymic mood, full affect  Recent Labs: 08/24/2023: ALT  21 10/12/2023: BUN 15; Creatinine, Ser 0.75; Hemoglobin 15.2; Platelets 160; Potassium 3.9; Sodium 141    Lipid Panel No results found for: CHOL, HDL, LDLCALC, TRIG    Wt Readings from Last 3 Encounters:  10/25/23 160 lb (72.6 kg)  10/12/23 160 lb (72.6 kg)  08/24/23 149 lb 14.6 oz (68 kg)     ASSESSMENT AND PLAN:  Problem List Items Addressed This Visit     Chest pain of uncertain etiology - Primary   Relevant Orders   EKG 12-Lead (Completed)   Smoker   Atypical chest pain Likely musculoskeletal etiology Pain on the right side, 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 He will continue to work on this and does not want any assistance with chantix.    Patient was seen by Dr. Suzanne and will be referred back to [facility] for further follow-up of the issues detailed above   Signed, Velinda Lunger, M.D., Ph.D. [facility] [place], [place] [phone]

## 2023-10-25 ENCOUNTER — Ambulatory Visit: Attending: Cardiovascular Disease | Admitting: Cardiovascular Disease

## 2023-10-25 ENCOUNTER — Encounter: Payer: Self-pay | Admitting: Cardiovascular Disease

## 2023-10-25 VITALS — BP 106/80 | HR 58 | Ht 67.0 in | Wt 160.0 lb

## 2023-10-25 DIAGNOSIS — F17200 Nicotine dependence, unspecified, uncomplicated: Secondary | ICD-10-CM

## 2023-10-25 DIAGNOSIS — R079 Chest pain, unspecified: Secondary | ICD-10-CM | POA: Diagnosis not present

## 2023-10-25 NOTE — Patient Instructions (Signed)
 Medication Instructions:  No changes  If you need a refill on your cardiac medications before your next appointment, please call your pharmacy.   Lab work: No new labs needed  Testing/Procedures: No new testing needed  Follow-Up: At [facility], you and your health needs are our priority.  As part of our continuing mission to provide you with exceptional heart care, we have created designated Provider Care Teams.  These Care Teams include your primary Cardiologist (physician) and Advanced Practice Providers (APPs -  Physician Assistants and Nurse Practitioners) who all work together to provide you with the care you need, when you need it.  You will need a follow up appointment in as needed  Providers on your designated Care Team:   Lonni Meager, NP Bernardino Bring, PA-C Cadence [place], [place]  COVID-19 Vaccine Information can be found at: [email or web address] For questions related to vaccine distribution or appointments, please email [email or web address] or call [phone].

## 2023-11-17 ENCOUNTER — Emergency Department
Admission: EM | Admit: 2023-11-17 | Discharge: 2023-11-17 | Disposition: A | Discharge status: Home / Self Care | Attending: Emergency Medicine | Admitting: Emergency Medicine

## 2023-11-17 ENCOUNTER — Emergency Department

## 2023-11-17 ENCOUNTER — Other Ambulatory Visit: Payer: Self-pay

## 2023-11-17 DIAGNOSIS — G5602 Carpal tunnel syndrome, left upper limb: Secondary | ICD-10-CM | POA: Diagnosis not present

## 2023-11-17 DIAGNOSIS — M25532 Pain in left wrist: Secondary | ICD-10-CM | POA: Diagnosis present

## 2023-11-17 NOTE — ED Triage Notes (Signed)
 Pt comes with left hand pain for two days. Pt denies any known injuries.

## 2023-11-17 NOTE — Discharge Instructions (Signed)
 Please take the meloxicam once a day for 2 weeks.   You can take 650 mg of Tylenol every 6 hours as needed for pain. You can use ice, heat, muscle creams and other topical pain relievers as well.

## 2023-11-17 NOTE — ED Notes (Signed)
 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

## 2023-11-17 NOTE — ED Provider Notes (Signed)
 [facility] Provider Note    Event Date/Time   First MD Initiated Contact with Patient 11/17/23 1051     (approximate)   History   Hand Pain   HPI  Carl Pena is a 53 y.o. male with PMH of GERD presents or evaluation of atraumatic hand and wrist pain.  Patient reports onset 2 days ago.  No specific injuries.  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  Denies numbness and tingling in the fingertips.      Physical Exam   Triage Vital Signs: ED Triage Vitals  Encounter Vitals Group     BP 11/17/23 1029 122/88     Girls Systolic BP Percentile --      Girls Diastolic BP Percentile --      Boys Systolic BP Percentile --      Boys Diastolic BP Percentile --      Pulse Rate 11/17/23 1029 66     Resp 11/17/23 1029 17     Temp 11/17/23 1029 98 F (36.7 C)     Temp src --      SpO2 11/17/23 1029 97 %     Weight 11/17/23 1028 160 lb (72.6 kg)     Height 11/17/23 1028 5' 7 (1.702 m)     Head Circumference --      Peak Flow --      Pain Score 11/17/23 1028 8     Pain Loc --      Pain Education --      Exclude from Growth Chart --     Most recent vital signs: Vitals:   11/17/23 1029  BP: 122/88  Pulse: 66  Resp: 17  Temp: 98 F (36.7 C)  SpO2: 97%   General: Awake, no distress.  CV:  Good peripheral perfusion.  Resp:  Normal effort.  Abd:  No distention.  Other:  Left hand is not swollen when compared with the right, tender to palpation over the carpal tunnel, radial pulse 2+ and regular, capillary refill appropriate in all fingertips, grip strength is equal bilaterally, pain is worse with flexion of the wrist.    ED Results / Procedures / Treatments   Labs (all labs ordered are listed, but only abnormal results are displayed) Labs Reviewed - No data to display   RADIOLOGY  Left hand x-ray obtained, interpreted the images as well as reviewed the radiologist report which was negative for any acute  abnormalities.  PROCEDURES:  Critical Care performed: No  Procedures   MEDICATIONS ORDERED IN ED: Medications - No data to display   IMPRESSION / MDM / ASSESSMENT AND PLAN / ED COURSE  I reviewed the triage vital signs and the nursing notes.                             53 year old male presents for evaluation of left hand pain.  Vital signs are stable patient NAD on exam.  Differential diagnosis includes, but is not limited to, carpal tunnel, muscle strain, wrist sprain, wrist fracture.  Patient's presentation is most consistent with acute complicated illness / injury requiring diagnostic workup.  Left hand x-ray is negative.  Based on physical exam findings suspect carpal tunnel syndrome.  Will place patient in a wrist brace.  Advised him to take previously prescribed meloxicam for 2 weeks as well as Tylenol.  Discussed ice, heat and topical pain relievers.  Will provide him follow-up information for hand specialist.  He was given a note for work.  He voiced understanding, all questions were answered and he was stable at discharge.      FINAL CLINICAL IMPRESSION(S) / ED DIAGNOSES   Final diagnoses:  Carpal tunnel syndrome of left wrist     Rx / DC Orders   ED Discharge Orders     None        Note:  This document was prepared using Dragon voice recognition software and may include unintentional dictation errors.   Cleaster Tinnie LABOR, PA-C 11/17/23 1148    Waymond Lorelle Cummins, MD 11/17/23 [phone]
